# Patient Record
Sex: Male | Born: 1995 | Race: White | Hispanic: No | Marital: Single | Smoking: Never smoker
Health system: Southern US, Community
[De-identification: ages and names within clinical notes are randomized; demographics above are authoritative.]

## PROBLEM LIST (undated history)

## (undated) HISTORY — PX: APPENDECTOMY: SHX54

---

## 2018-06-02 ENCOUNTER — Emergency Department (HOSPITAL_COMMUNITY)
Admission: EM | Admit: 2018-06-02 | Discharge: 2018-06-02 | Disposition: A | Discharge status: Home / Self Care | Attending: Emergency Medicine | Admitting: Emergency Medicine

## 2018-06-02 ENCOUNTER — Encounter (HOSPITAL_COMMUNITY): Payer: Self-pay

## 2018-06-02 ENCOUNTER — Emergency Department (HOSPITAL_COMMUNITY)

## 2018-06-02 ENCOUNTER — Other Ambulatory Visit: Payer: Self-pay

## 2018-06-02 DIAGNOSIS — M542 Cervicalgia: Secondary | ICD-10-CM | POA: Insufficient documentation

## 2018-06-02 DIAGNOSIS — S298XXA Other specified injuries of thorax, initial encounter: Secondary | ICD-10-CM | POA: Insufficient documentation

## 2018-06-02 DIAGNOSIS — R55 Syncope and collapse: Secondary | ICD-10-CM | POA: Diagnosis not present

## 2018-06-02 DIAGNOSIS — Y990 Civilian activity done for income or pay: Secondary | ICD-10-CM | POA: Diagnosis not present

## 2018-06-02 DIAGNOSIS — W230XXA Caught, crushed, jammed, or pinched between moving objects, initial encounter: Secondary | ICD-10-CM | POA: Diagnosis not present

## 2018-06-02 DIAGNOSIS — S299XXA Unspecified injury of thorax, initial encounter: Secondary | ICD-10-CM

## 2018-06-02 DIAGNOSIS — Y929 Unspecified place or not applicable: Secondary | ICD-10-CM | POA: Insufficient documentation

## 2018-06-02 DIAGNOSIS — Y939 Activity, unspecified: Secondary | ICD-10-CM | POA: Insufficient documentation

## 2018-06-02 DIAGNOSIS — R0789 Other chest pain: Secondary | ICD-10-CM

## 2018-06-02 LAB — CBC WITH DIFFERENTIAL/PLATELET
Abs Immature Granulocytes: 0.1 10*3/uL (ref 0.0–0.1)
BASOS PCT: 0 %
Basophils Absolute: 0.1 10*3/uL (ref 0.0–0.1)
EOS ABS: 0.1 10*3/uL (ref 0.0–0.7)
EOS PCT: 0 %
HCT: 46 % (ref 39.0–52.0)
Hemoglobin: 15.5 g/dL (ref 13.0–17.0)
IMMATURE GRANULOCYTES: 1 %
Lymphocytes Relative: 13 %
Lymphs Abs: 2 10*3/uL (ref 0.7–4.0)
MCH: 30 pg (ref 26.0–34.0)
MCHC: 33.7 g/dL (ref 30.0–36.0)
MCV: 89.1 fL (ref 78.0–100.0)
Monocytes Absolute: 1.1 10*3/uL — ABNORMAL HIGH (ref 0.1–1.0)
Monocytes Relative: 7 %
NEUTROS PCT: 79 %
Neutro Abs: 12 10*3/uL — ABNORMAL HIGH (ref 1.7–7.7)
PLATELETS: 307 10*3/uL (ref 150–400)
RBC: 5.16 MIL/uL (ref 4.22–5.81)
RDW: 11.8 % (ref 11.5–15.5)
WBC: 15.3 10*3/uL — AB (ref 4.0–10.5)

## 2018-06-02 LAB — COMPREHENSIVE METABOLIC PANEL
ALT: 171 U/L — ABNORMAL HIGH (ref 0–44)
ANION GAP: 10 (ref 5–15)
AST: 107 U/L — AB (ref 15–41)
Albumin: 4.4 g/dL (ref 3.5–5.0)
Alkaline Phosphatase: 84 U/L (ref 38–126)
BUN: 9 mg/dL (ref 6–20)
CHLORIDE: 103 mmol/L (ref 98–111)
CO2: 28 mmol/L (ref 22–32)
Calcium: 9.3 mg/dL (ref 8.9–10.3)
Creatinine, Ser: 1.08 mg/dL (ref 0.61–1.24)
GFR calc Af Amer: >60 mL/min (ref >60)
GFR calc non Af Amer: >60 mL/min (ref >60)
Glucose, Bld: 96 mg/dL (ref 70–99)
POTASSIUM: 3.7 mmol/L (ref 3.5–5.1)
Sodium: 141 mmol/L (ref 135–145)
TOTAL PROTEIN: 7 g/dL (ref 6.5–8.1)
Total Bilirubin: 1.2 mg/dL (ref 0.3–1.2)

## 2018-06-02 LAB — I-STAT TROPONIN, ED: Troponin i, poc: 0.01 ng/mL (ref 0.00–0.08)

## 2018-06-02 MED ORDER — OXYCODONE-ACETAMINOPHEN 5-325 MG PO TABS
1.0000 | ORAL_TABLET | Freq: Once | ORAL | Status: AC
  Administered 2018-06-02: 1 via ORAL
  Filled 2018-06-02: qty 1

## 2018-06-02 MED ORDER — IOPAMIDOL (ISOVUE-370) INJECTION 76%
80.0000 mL | Freq: Once | INTRAVENOUS | Status: AC | PRN
  Administered 2018-06-02: 80 mL via INTRAVENOUS

## 2018-06-02 MED ORDER — IOPAMIDOL (ISOVUE-370) INJECTION 76%
INTRAVENOUS | Status: AC
  Filled 2018-06-02: qty 100

## 2018-06-02 NOTE — ED Notes (Signed)
Patient Alert and oriented to baseline. Stable and ambulatory to baseline. Patient verbalized understanding of the discharge instructions.  Patient belongings were taken by the patient.   

## 2018-06-02 NOTE — ED Provider Notes (Signed)
Patient placed in Quick Look pathway, seen and evaluated   Chief Complaint: chest injury  HPI:   Pt is a 22 y/o male with no PMHx who presents to the ED today c/o a chest injury that occurred PTA. Pt was at work and states that he was laying flat across the fuel tank and the dump truck bed hit him in the chest. States he then had a syncopal episode. States he has 6/10 bilat lower chest pain. States he has pain with inspiration. Also c/o pain to his back. Has been ambulatory since the accident. Denies abd pain. Denies NV.   ROS: chest trauma (one)  Physical Exam:   Gen: No distress  Neuro: Awake and Alert  Skin: Warm    Focused Exam: TTP to mediastinum and bilat lower chest wall. Erythema   Initiation of care has begun. The patient has been counseled on the process, plan, and necessity for staying for the completion/evaluation, and the remainder of the medical screening examination  Pt advised to inform nursing staff immediately if they experience any new or worsening of symptoms while waiting in the waiting room.  Nursing staff noted that patient will need a room ASAP.    Karrie Meres, PA-C 06/02/18 1742    Gerhard Munch, MD 06/03/18 [phone]

## 2018-06-02 NOTE — ED Triage Notes (Signed)
Patient here for having dump truck bed hit him in his cheat.  Patient was lying on a fuel tank when truck bed hit him.  Patient reports seeing truck bed coming at him then waking up with lots of people around him.  A&Ox4 at this time.

## 2018-06-02 NOTE — ED Notes (Signed)
Pt has abrasions to chest, lungs clear through out with good air movement

## 2018-06-02 NOTE — ED Notes (Addendum)
Pt not in room at this time

## 2018-06-02 NOTE — ED Provider Notes (Signed)
[facility] EMERGENCY DEPARTMENT Provider Note   CSN: [id number] Arrival date & time: 06/02/18  1626     History   Chief Complaint Chief Complaint  Patient presents with  . Chest Injury    HPI Colton Murphy is a 22 y.o. male.  The history is provided by the patient.  Trauma Mechanism of injury: crushed by bed of dump truck Injury location: torso and head/neck Injury location detail: neck and L chest and R chest Incident location: at work Time since incident: 4 hours Arrived directly from scene: no   EMS/PTA data:      Ambulatory at scene: yes      Responsiveness: alert      Oriented to: person, place, situation and time      Loss of consciousness: yes      LOC duration: less than 5 minutes.      Airway interventions: none      Breathing interventions: none      Airway condition since incident: stable      Breathing condition since incident: stable      Circulation condition since incident: stable  Current symptoms:      Pain quality: burning      Pain timing: constant      Associated symptoms:            Reports back pain, chest pain, loss of consciousness and neck pain.            Denies abdominal pain, headache, seizures and vomiting.   Relevant PMH:      Tetanus status: unknown   History reviewed. No pertinent past medical history.  There are no active problems to display for this patient.   Past Surgical History:  Procedure Laterality Date  . APPENDECTOMY          Home Medications    Prior to Admission medications   Not on File    Family History History reviewed. No pertinent family history.  Social History Social History   Tobacco Use  . Smoking status: Never Smoker  . Smokeless tobacco: Never Used  Substance Use Topics  . Alcohol use: Never    Frequency: Never  . Drug use: Never     Allergies   Patient has no known allergies.   Review of Systems Review of Systems  Constitutional: Negative for chills and  fever.  HENT: Negative for ear pain and sore throat.   Eyes: Negative for pain and visual disturbance.  Respiratory: Negative for cough and shortness of breath.   Cardiovascular: Positive for chest pain. Negative for palpitations.  Gastrointestinal: Negative for abdominal pain and vomiting.  Genitourinary: Negative for dysuria and hematuria.  Musculoskeletal: Positive for back pain and neck pain. Negative for arthralgias.  Skin: Negative for color change and rash.  Neurological: Positive for loss of consciousness. Negative for seizures, syncope and headaches.  All other systems reviewed and are negative.    Physical Exam Updated Vital Signs BP 111/76   Pulse 64   Temp 98.3 F (36.8 C) (Oral)   Resp 18   Ht 6' (1.829 m)   Wt 97.5 kg   SpO2 98%   BMI 29.16 kg/m   Physical Exam  Constitutional: He appears well-developed and well-nourished.  HENT:  Head: Normocephalic and atraumatic.  Eyes: Conjunctivae are normal.  Neck: Neck supple.  Cardiovascular: Normal rate, regular rhythm and intact distal pulses.  No murmur heard. Pulmonary/Chest: Effort normal and breath sounds normal. No respiratory distress. He exhibits tenderness.  Abdominal: Soft. There is no tenderness.  Musculoskeletal: He exhibits tenderness (T10 and chest wall). He exhibits no edema.  Neurological: He is alert.  Skin: Skin is warm and dry.  Abrasions over chest wall and neck, redness over T10 vertebra  Psychiatric: He has a normal mood and affect.  Nursing note and vitals reviewed.    ED Treatments / Results  Labs (all labs ordered are listed, but only abnormal results are displayed) Labs Reviewed  CBC WITH DIFFERENTIAL/PLATELET - Abnormal; Notable for the following components:      Result Value   WBC 15.3 (*)    Neutro Abs 12.0 (*)    Monocytes Absolute 1.1 (*)    All other components within normal limits  COMPREHENSIVE METABOLIC PANEL - Abnormal; Notable for the following components:   AST 107  (*)    ALT 171 (*)    All other components within normal limits  I-STAT TROPONIN, ED    EKG EKG Interpretation  Date/Time:  Tuesday June 02 2018 16:42:55 EDT Ventricular Rate:  78 PR Interval:  136 QRS Duration: 84 QT Interval:  342 QTC Calculation: 389 R Axis:   68 Text Interpretation:  Normal sinus rhythm unremarkable ECG Confirmed by Gerhard Munch [phone]) on 06/02/2018 5:25:56 PM Also confirmed by Gerhard Munch ([id number]), editor Lyndon, Tamera Punt ([id number])  on 06/03/2018 8:08:27 AM   Radiology Dg Ribs Bilateral W/chest  Result Date: 06/02/2018 CLINICAL DATA:  Hit in chest short of breath and chest pain EXAM: BILATERAL RIBS AND CHEST - 4+ VIEW COMPARISON:  None. FINDINGS: Single-view chest demonstrates no acute airspace disease or effusion. Normal heart size. No pneumothorax. Bilateral rib series demonstrates no definite acute displaced rib fracture. IMPRESSION: Negative. Electronically Signed   By: Jasmine Pang M.D.   On: 06/02/2018 17:54   Ct Head Wo Contrast  Result Date: 06/02/2018 CLINICAL DATA:  Crush injury. EXAM: CT HEAD WITHOUT CONTRAST TECHNIQUE: Contiguous axial images were obtained from the base of the skull through the vertex without intravenous contrast. COMPARISON:  None. FINDINGS: Brain: There is no mass, hemorrhage or extra-axial collection. The size and configuration of the ventricles and extra-axial CSF spaces are normal. There is no acute or chronic infarction. The brain parenchyma is normal. Vascular: No abnormal hyperdensity of the major intracranial arteries or dural venous sinuses. No intracranial atherosclerosis. Skull: The visualized skull base, calvarium and extracranial soft tissues are normal. Sinuses/Orbits: No fluid levels or advanced mucosal thickening of the visualized paranasal sinuses. No mastoid or middle ear effusion. The orbits are normal. IMPRESSION: Normal head CT. Electronically Signed   By: Deatra Robinson M.D.   On: 06/02/2018 20:10   Ct Angio  Neck W And/or Wo Contrast  Result Date: 06/02/2018 CLINICAL DATA:  Automotive crush injury EXAM: CT ANGIOGRAPHY NECK TECHNIQUE: Multidetector CT imaging of the neck was performed using the standard protocol during bolus administration of intravenous contrast. Multiplanar CT image reconstructions and MIPs were obtained to evaluate the vascular anatomy. Carotid stenosis measurements (when applicable) are obtained utilizing NASCET criteria, using the distal internal carotid diameter as the denominator. CONTRAST:  80mL ISOVUE-370 IOPAMIDOL (ISOVUE-370) INJECTION 76% COMPARISON:  None. FINDINGS: Aortic arch: Not included in the field of view. Proximal arch vessels are normal. Right carotid system: --Common carotid artery: Widely patent origin without common carotid artery dissection or aneurysm. --Internal carotid artery: No dissection, occlusion or aneurysm. No hemodynamically significant stenosis. --External carotid artery: No acute abnormality. Left carotid system: --Common carotid artery: Widely patent origin without common carotid artery dissection  or aneurysm. --Internal carotid artery:No dissection, occlusion or aneurysm. No hemodynamically significant stenosis. --External carotid artery: No acute abnormality. Vertebral arteries: Right dominant configuration. Both origins are normal. No dissection, occlusion or flow-limiting stenosis to the vertebrobasilar confluence. Skeleton: There is no bony spinal canal stenosis. No lytic or blastic lesion. Other neck: Normal pharynx, larynx and major salivary glands. No cervical lymphadenopathy. Unremarkable thyroid gland. Upper chest: No pneumothorax or pleural effusion. No nodules or masses. Review of the MIP images confirms the above findings IMPRESSION: Normal CTA of the neck. Electronically Signed   By: Deatra Robinson M.D.   On: 06/02/2018 20:08   Ct Chest W Contrast  Result Date: 06/02/2018 CLINICAL DATA:  22 year old male with injury to the chest, crushed by a truck  bed. Bilateral chest pain. EXAM: CT CHEST WITH CONTRAST TECHNIQUE: Multidetector CT imaging of the chest was performed during intravenous contrast administration. CONTRAST:  52mL ISOVUE-370 IOPAMIDOL (ISOVUE-370) INJECTION 76% COMPARISON:  No priors. FINDINGS: Cardiovascular: No acute abnormality of the thoracic aorta or the great vessels of the mediastinum. Heart size is normal. There is no significant pericardial fluid, thickening or pericardial calcification. No significant atherosclerotic disease in the thoracic aorta or the coronary arteries. Mediastinum/Nodes: No high attenuation fluid in the mediastinum to suggest posttraumatic hemorrhage. No pathologically enlarged mediastinal or hilar lymph nodes. Esophagus is unremarkable in appearance. No lymphadenopathy in the axillary regions. Lungs/Pleura: No pneumothorax. No acute consolidative airspace disease. No pleural effusions. No suspicious appearing pulmonary nodules or masses are noted. Upper Abdomen: Unremarkable. Musculoskeletal: No acute displaced fractures or aggressive appearing lytic or blastic lesions are noted in the visualized portions of the skeleton. IMPRESSION: 1. No evidence of significant acute traumatic injury to the thorax. Electronically Signed   By: Trudie Reed M.D.   On: 06/02/2018 20:12    Procedures Procedures (including critical care time)  Medications Ordered in ED Medications  oxyCODONE-acetaminophen (PERCOCET/ROXICET) 5-325 MG per tablet 1 tablet (1 tablet Oral Given 06/02/18 1900)  iopamidol (ISOVUE-370) 76 % injection 80 mL (80 mLs Intravenous Contrast Given 06/02/18 1922)     Initial Impression / Assessment and Plan / ED Course  I have reviewed the triage vital signs and the nursing notes.  Pertinent labs & imaging results that were available during my care of the patient were reviewed by me and considered in my medical decision making (see chart for details).     The patient is a 22yoM who suffered a crush  injury to his chest and then a fall. The patient is amnestic to the event. He has chest pain and neck pain. Trauma scans reveal no injuries. Do not suspect vascular injury to neck, spine injury, pneumothorax, r other traumatic injuries. Patient given return precautions and instructed to follow up with primary care provider. Patient endorses understanding of and agreement with the instructions.  Patient care supervised by Dr. Jeraldine Loots.  Nash Dimmer, MD   Final Clinical Impressions(s) / ED Diagnoses   Final diagnoses:  Chest trauma  Chest wall pain  Blunt trauma to chest, initial encounter    ED Discharge Orders    None       Nash Dimmer, MD 06/03/18 1346    Gerhard Munch, MD 06/03/18 [phone]

## 2018-06-02 NOTE — ED Notes (Addendum)
Patient transported to X-ray 

## 2019-06-25 IMAGING — CT CT ANGIO NECK
1 of 10 series · 5 of 33 positions shown · IV contrast (iopamidol)
Comparison: None.

CLINICAL DATA: Automotive crush injury

EXAM:
CT ANGIOGRAPHY NECK
TECHNIQUE: Multidetector CT imaging of the neck was performed using the
standard protocol during bolus administration of intravenous
contrast. Multiplanar CT image reconstructions and MIPs were
obtained to evaluate the vascular anatomy. Carotid stenosis
measurements (when applicable) are obtained utilizing NASCET
criteria, using the distal internal carotid diameter as the
denominator.
CONTRAST:  80mL 7NLZZC-8UR IOPAMIDOL (7NLZZC-8UR) INJECTION 76%

[Series 13: ax thins · axial · 0.39mm/px · z∈[-237,-92]mm · 5 of 219 slices shown]
[im 37/219  soft-tissue]
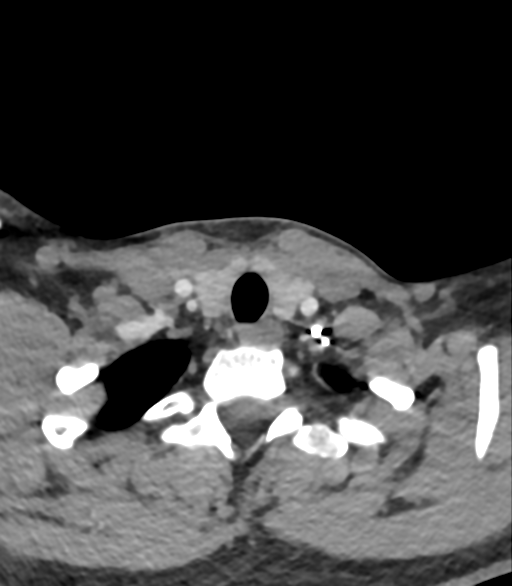
[im 73/219  bone]
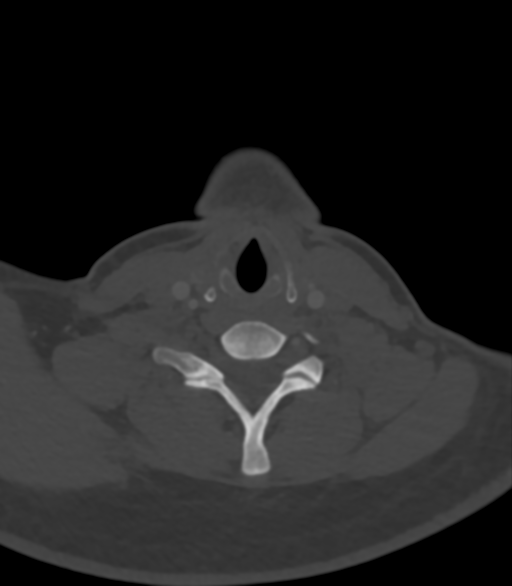
[im 110/219  soft-tissue]
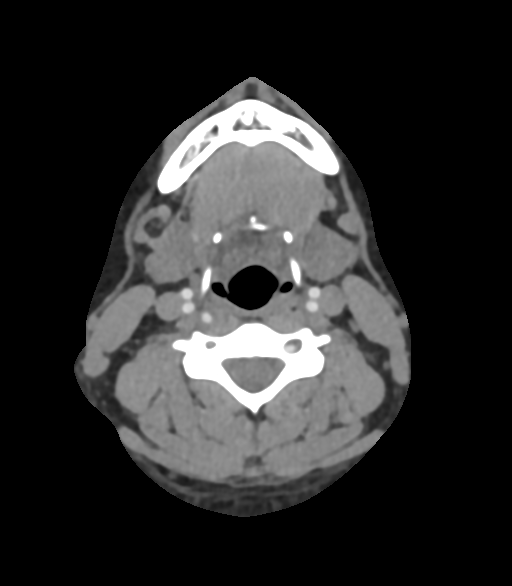
[im 146/219  bone]
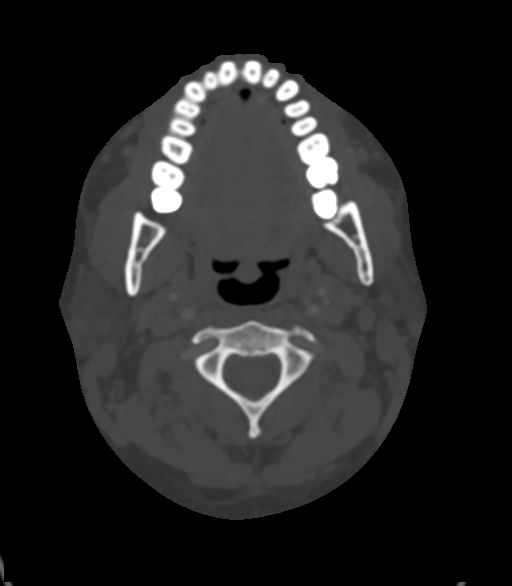
[im 182/219  soft-tissue]
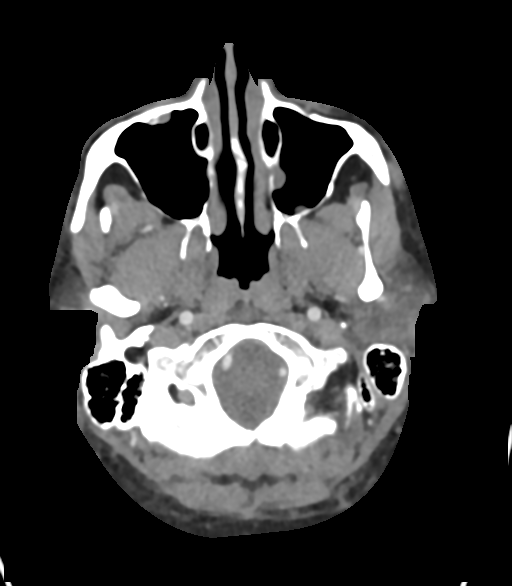

[5 of 33 positions shown; findings below may reference images not displayed]

FINDINGS: Aortic arch: Not included in the field of view. Proximal arch
vessels are normal.

Right carotid system:

--Common carotid artery: Widely patent origin without common carotid
artery dissection or aneurysm.

--Internal carotid artery: No dissection, occlusion or aneurysm. No
hemodynamically significant stenosis.

--External carotid artery: No acute abnormality.

Left carotid system:

--Common carotid artery: Widely patent origin without common carotid
artery dissection or aneurysm.

--Internal carotid artery:No dissection, occlusion or aneurysm. No
hemodynamically significant stenosis.

--External carotid artery: No acute abnormality.

Vertebral arteries: Right dominant configuration. Both origins are
normal. No dissection, occlusion or flow-limiting stenosis to the
vertebrobasilar confluence.

Skeleton: There is no bony spinal canal stenosis. No lytic or
blastic lesion.

Other neck: Normal pharynx, larynx and major salivary glands. No
cervical lymphadenopathy. Unremarkable thyroid gland.

Upper chest: No pneumothorax or pleural effusion. No nodules or
masses.

Review of the MIP images confirms the above findings
IMPRESSION: Normal CTA of the neck.
